# Patient Record
Sex: Male | Born: 1970 | Race: White | Hispanic: No | Marital: Married | Smoking: Never smoker
Health system: Southern US, Community
[De-identification: ages and names within clinical notes are randomized; demographics above are authoritative.]

## PROBLEM LIST (undated history)

## (undated) DIAGNOSIS — I10 Essential (primary) hypertension: Secondary | ICD-10-CM

## (undated) DIAGNOSIS — E785 Hyperlipidemia, unspecified: Secondary | ICD-10-CM

## (undated) DIAGNOSIS — M109 Gout, unspecified: Secondary | ICD-10-CM

## (undated) DIAGNOSIS — R079 Chest pain, unspecified: Secondary | ICD-10-CM

## (undated) HISTORY — PX: CHOLECYSTECTOMY: SHX55

## (undated) HISTORY — DX: Hyperlipidemia, unspecified: E78.5

## (undated) HISTORY — DX: Chest pain, unspecified: R07.9

---

## 2001-07-14 ENCOUNTER — Encounter: Payer: Self-pay | Admitting: Internal Medicine

## 2001-07-14 ENCOUNTER — Ambulatory Visit (HOSPITAL_COMMUNITY): Admission: RE | Admit: 2001-07-14 | Discharge: 2001-07-14 | Payer: Self-pay | Admitting: Internal Medicine

## 2001-08-22 ENCOUNTER — Ambulatory Visit (HOSPITAL_COMMUNITY): Admission: RE | Admit: 2001-08-22 | Discharge: 2001-08-22 | Payer: Self-pay | Admitting: General Surgery

## 2003-05-05 ENCOUNTER — Inpatient Hospital Stay (HOSPITAL_COMMUNITY): Admission: EM | Admit: 2003-05-05 | Discharge: 2003-05-07 | Payer: Self-pay | Admitting: Emergency Medicine

## 2004-03-30 ENCOUNTER — Emergency Department (HOSPITAL_COMMUNITY): Admission: EM | Admit: 2004-03-30 | Discharge: 2004-03-31 | Payer: Self-pay | Admitting: Emergency Medicine

## 2005-05-04 ENCOUNTER — Ambulatory Visit: Payer: Self-pay | Admitting: Internal Medicine

## 2005-05-18 ENCOUNTER — Ambulatory Visit: Payer: Self-pay | Admitting: Internal Medicine

## 2005-05-29 ENCOUNTER — Encounter (INDEPENDENT_AMBULATORY_CARE_PROVIDER_SITE_OTHER): Payer: Self-pay | Admitting: Internal Medicine

## 2005-06-22 ENCOUNTER — Ambulatory Visit: Payer: Self-pay | Admitting: Internal Medicine

## 2005-07-20 ENCOUNTER — Ambulatory Visit: Payer: Self-pay | Admitting: Internal Medicine

## 2005-07-27 ENCOUNTER — Encounter (INDEPENDENT_AMBULATORY_CARE_PROVIDER_SITE_OTHER): Payer: Self-pay | Admitting: Internal Medicine

## 2005-07-27 LAB — CONVERTED CEMR LAB
RBC count: 4.59 10*6/uL
TSH: 2.102 microintl units/mL
WBC, blood: 8.2 10*3/uL

## 2006-02-01 ENCOUNTER — Ambulatory Visit: Payer: Self-pay | Admitting: Internal Medicine

## 2006-02-01 LAB — CONVERTED CEMR LAB: Hgb A1c MFr Bld: 8.8 %

## 2006-02-02 ENCOUNTER — Encounter (INDEPENDENT_AMBULATORY_CARE_PROVIDER_SITE_OTHER): Payer: Self-pay | Admitting: Internal Medicine

## 2006-02-10 ENCOUNTER — Ambulatory Visit: Payer: Self-pay | Admitting: Internal Medicine

## 2006-03-15 ENCOUNTER — Ambulatory Visit: Payer: Self-pay | Admitting: Internal Medicine

## 2006-04-03 ENCOUNTER — Encounter: Payer: Self-pay | Admitting: Internal Medicine

## 2006-04-03 DIAGNOSIS — R809 Proteinuria, unspecified: Secondary | ICD-10-CM | POA: Insufficient documentation

## 2006-04-03 DIAGNOSIS — G609 Hereditary and idiopathic neuropathy, unspecified: Secondary | ICD-10-CM | POA: Insufficient documentation

## 2006-04-03 DIAGNOSIS — E785 Hyperlipidemia, unspecified: Secondary | ICD-10-CM | POA: Insufficient documentation

## 2006-04-03 DIAGNOSIS — I10 Essential (primary) hypertension: Secondary | ICD-10-CM | POA: Insufficient documentation

## 2006-04-26 ENCOUNTER — Ambulatory Visit: Payer: Self-pay | Admitting: Internal Medicine

## 2006-05-13 ENCOUNTER — Encounter (INDEPENDENT_AMBULATORY_CARE_PROVIDER_SITE_OTHER): Payer: Self-pay | Admitting: Internal Medicine

## 2006-05-24 ENCOUNTER — Ambulatory Visit: Payer: Self-pay | Admitting: Internal Medicine

## 2006-05-24 DIAGNOSIS — E1165 Type 2 diabetes mellitus with hyperglycemia: Secondary | ICD-10-CM

## 2006-05-24 DIAGNOSIS — IMO0002 Reserved for concepts with insufficient information to code with codable children: Secondary | ICD-10-CM | POA: Insufficient documentation

## 2006-05-24 DIAGNOSIS — E118 Type 2 diabetes mellitus with unspecified complications: Secondary | ICD-10-CM

## 2006-05-24 LAB — CONVERTED CEMR LAB
Cholesterol, target level: 200 mg/dL
HDL goal, serum: 40 mg/dL
LDL Goal: 100 mg/dL

## 2006-05-25 ENCOUNTER — Encounter (INDEPENDENT_AMBULATORY_CARE_PROVIDER_SITE_OTHER): Payer: Self-pay | Admitting: Internal Medicine

## 2006-05-25 LAB — CONVERTED CEMR LAB
ALT: 26 units/L (ref 0–53)
AST: 18 units/L (ref 0–37)
Albumin: 4.3 g/dL (ref 3.5–5.2)
Alkaline Phosphatase: 109 units/L (ref 39–117)
BUN: 15 mg/dL (ref 6–23)
CO2: 20 meq/L (ref 19–32)
Calcium: 9.7 mg/dL (ref 8.4–10.5)
Chloride: 101 meq/L (ref 96–112)
Cholesterol: 160 mg/dL (ref 0–200)
Creatinine, Ser: 0.76 mg/dL (ref 0.40–1.50)
Creatinine, Urine: 74.5 mg/dL
Glucose, Bld: 223 mg/dL — ABNORMAL HIGH (ref 70–99)
HDL: 42 mg/dL (ref >39)
LDL Cholesterol: 89 mg/dL (ref 0–99)
Microalb Creat Ratio: 49.7 mg/g — ABNORMAL HIGH (ref 0.0–30.0)
Microalb, Ur: 3.7 mg/dL — ABNORMAL HIGH (ref 0.00–1.89)
Potassium: 4.2 meq/L (ref 3.5–5.3)
Sodium: 140 meq/L (ref 135–145)
Total Bilirubin: 0.6 mg/dL (ref 0.3–1.2)
Total CHOL/HDL Ratio: 3.8
Total Protein: 7.7 g/dL (ref 6.0–8.3)
Triglycerides: 145 mg/dL (ref <150)
VLDL: 29 mg/dL (ref 0–40)

## 2006-06-16 ENCOUNTER — Encounter (INDEPENDENT_AMBULATORY_CARE_PROVIDER_SITE_OTHER): Payer: Self-pay | Admitting: Internal Medicine

## 2006-06-22 ENCOUNTER — Telehealth (INDEPENDENT_AMBULATORY_CARE_PROVIDER_SITE_OTHER): Payer: Self-pay | Admitting: Internal Medicine

## 2006-08-13 ENCOUNTER — Encounter (INDEPENDENT_AMBULATORY_CARE_PROVIDER_SITE_OTHER): Payer: Self-pay | Admitting: Internal Medicine

## 2006-08-16 ENCOUNTER — Ambulatory Visit: Payer: Self-pay | Admitting: Internal Medicine

## 2006-08-16 DIAGNOSIS — G4700 Insomnia, unspecified: Secondary | ICD-10-CM | POA: Insufficient documentation

## 2006-08-16 LAB — CONVERTED CEMR LAB: Hgb A1c MFr Bld: 9.9 %

## 2006-08-20 ENCOUNTER — Encounter (INDEPENDENT_AMBULATORY_CARE_PROVIDER_SITE_OTHER): Payer: Self-pay | Admitting: Internal Medicine

## 2006-09-13 ENCOUNTER — Ambulatory Visit: Payer: Self-pay | Admitting: Internal Medicine

## 2006-10-11 ENCOUNTER — Ambulatory Visit: Payer: Self-pay | Admitting: Internal Medicine

## 2006-11-08 ENCOUNTER — Ambulatory Visit: Payer: Self-pay | Admitting: Internal Medicine

## 2006-11-08 LAB — CONVERTED CEMR LAB: Hgb A1c MFr Bld: 10.8 %

## 2006-11-09 ENCOUNTER — Encounter (INDEPENDENT_AMBULATORY_CARE_PROVIDER_SITE_OTHER): Payer: Self-pay | Admitting: Internal Medicine

## 2006-11-10 ENCOUNTER — Telehealth (INDEPENDENT_AMBULATORY_CARE_PROVIDER_SITE_OTHER): Payer: Self-pay | Admitting: *Deleted

## 2006-11-10 LAB — CONVERTED CEMR LAB
ALT: 20 units/L (ref 0–53)
CO2: 25 meq/L (ref 19–32)
Calcium: 9.9 mg/dL (ref 8.4–10.5)
Chloride: 101 meq/L (ref 96–112)
Cholesterol: 155 mg/dL (ref 0–200)
Sodium: 137 meq/L (ref 135–145)
Total Bilirubin: 0.7 mg/dL (ref 0.3–1.2)
Total Protein: 7.8 g/dL (ref 6.0–8.3)
VLDL: 22 mg/dL (ref 0–40)

## 2006-12-20 ENCOUNTER — Ambulatory Visit: Payer: Self-pay | Admitting: Internal Medicine

## 2007-01-31 ENCOUNTER — Ambulatory Visit: Payer: Self-pay | Admitting: Internal Medicine

## 2007-01-31 LAB — CONVERTED CEMR LAB
Blood Glucose, Fingerstick: 122
Hgb A1c MFr Bld: 8.8 %

## 2007-02-22 ENCOUNTER — Encounter (INDEPENDENT_AMBULATORY_CARE_PROVIDER_SITE_OTHER): Payer: Self-pay | Admitting: Internal Medicine

## 2007-03-30 ENCOUNTER — Ambulatory Visit: Payer: Self-pay | Admitting: Internal Medicine

## 2007-03-30 DIAGNOSIS — H6690 Otitis media, unspecified, unspecified ear: Secondary | ICD-10-CM | POA: Insufficient documentation

## 2007-04-24 ENCOUNTER — Emergency Department (HOSPITAL_COMMUNITY): Admission: EM | Admit: 2007-04-24 | Discharge: 2007-04-24 | Payer: Self-pay | Admitting: Family Medicine

## 2007-04-25 ENCOUNTER — Ambulatory Visit: Payer: Self-pay | Admitting: Internal Medicine

## 2007-04-25 DIAGNOSIS — M940 Chondrocostal junction syndrome [Tietze]: Secondary | ICD-10-CM | POA: Insufficient documentation

## 2007-04-25 DIAGNOSIS — R059 Cough, unspecified: Secondary | ICD-10-CM | POA: Insufficient documentation

## 2007-04-25 DIAGNOSIS — R05 Cough: Secondary | ICD-10-CM | POA: Insufficient documentation

## 2007-05-09 ENCOUNTER — Ambulatory Visit: Payer: Self-pay | Admitting: Internal Medicine

## 2007-05-10 ENCOUNTER — Telehealth (INDEPENDENT_AMBULATORY_CARE_PROVIDER_SITE_OTHER): Payer: Self-pay | Admitting: *Deleted

## 2007-05-10 LAB — CONVERTED CEMR LAB
Alkaline Phosphatase: 91 units/L (ref 39–117)
Creatinine, Ser: 0.92 mg/dL (ref 0.40–1.50)
Glucose, Bld: 197 mg/dL — ABNORMAL HIGH (ref 70–99)
HDL: 58 mg/dL (ref >39)
LDL Cholesterol: 109 mg/dL — ABNORMAL HIGH (ref 0–99)
Sodium: 138 meq/L (ref 135–145)
Total Bilirubin: 0.6 mg/dL (ref 0.3–1.2)
Total CHOL/HDL Ratio: 3.3
Total Protein: 8.5 g/dL — ABNORMAL HIGH (ref 6.0–8.3)
Triglycerides: 115 mg/dL (ref <150)
VLDL: 23 mg/dL (ref 0–40)

## 2007-06-21 ENCOUNTER — Telehealth (INDEPENDENT_AMBULATORY_CARE_PROVIDER_SITE_OTHER): Payer: Self-pay | Admitting: *Deleted

## 2007-07-04 ENCOUNTER — Ambulatory Visit: Payer: Self-pay | Admitting: Internal Medicine

## 2007-07-04 DIAGNOSIS — E669 Obesity, unspecified: Secondary | ICD-10-CM | POA: Insufficient documentation

## 2007-08-18 ENCOUNTER — Ambulatory Visit: Payer: Self-pay | Admitting: Internal Medicine

## 2007-09-12 ENCOUNTER — Ambulatory Visit: Payer: Self-pay | Admitting: Internal Medicine

## 2007-11-14 ENCOUNTER — Ambulatory Visit: Payer: Self-pay | Admitting: Internal Medicine

## 2007-11-15 ENCOUNTER — Encounter (INDEPENDENT_AMBULATORY_CARE_PROVIDER_SITE_OTHER): Payer: Self-pay | Admitting: Internal Medicine

## 2007-11-16 LAB — CONVERTED CEMR LAB
Albumin: 4.7 g/dL (ref 3.5–5.2)
BUN: 21 mg/dL (ref 6–23)
CO2: 22 meq/L (ref 19–32)
Calcium: 9.7 mg/dL (ref 8.4–10.5)
Chloride: 101 meq/L (ref 96–112)
Cholesterol: 154 mg/dL (ref 0–200)
Creatinine, Ser: 0.84 mg/dL (ref 0.40–1.50)
Creatinine, Urine: 190.2 mg/dL
HDL: 55 mg/dL (ref >39)
Hemoglobin: 15.4 g/dL (ref 13.0–17.0)
Lymphocytes Relative: 35 % (ref 12–46)
Lymphs Abs: 4.1 10*3/uL — ABNORMAL HIGH (ref 0.7–4.0)
Microalb Creat Ratio: 38.9 mg/g — ABNORMAL HIGH (ref 0.0–30.0)
Monocytes Absolute: 1.1 10*3/uL — ABNORMAL HIGH (ref 0.1–1.0)
Monocytes Relative: 10 % (ref 3–12)
Neutro Abs: 6.3 10*3/uL (ref 1.7–7.7)
Neutrophils Relative %: 54 % (ref 43–77)
Potassium: 4.1 meq/L (ref 3.5–5.3)
RBC: 5.08 M/uL (ref 4.22–5.81)
Total CHOL/HDL Ratio: 2.8
WBC: 11.7 10*3/uL — ABNORMAL HIGH (ref 4.0–10.5)

## 2007-11-21 ENCOUNTER — Ambulatory Visit: Payer: Self-pay | Admitting: Internal Medicine

## 2007-11-21 DIAGNOSIS — J020 Streptococcal pharyngitis: Secondary | ICD-10-CM | POA: Insufficient documentation

## 2007-11-21 LAB — CONVERTED CEMR LAB
Inflenza A Ag: NEGATIVE
Rapid Strep: POSITIVE

## 2007-11-23 ENCOUNTER — Encounter (INDEPENDENT_AMBULATORY_CARE_PROVIDER_SITE_OTHER): Payer: Self-pay | Admitting: Internal Medicine

## 2007-12-26 ENCOUNTER — Ambulatory Visit: Payer: Self-pay | Admitting: Internal Medicine

## 2008-01-19 ENCOUNTER — Ambulatory Visit: Payer: Self-pay | Admitting: Internal Medicine

## 2008-02-06 ENCOUNTER — Ambulatory Visit: Payer: Self-pay | Admitting: Internal Medicine

## 2008-02-06 LAB — CONVERTED CEMR LAB
Blood Glucose, Fingerstick: 271
Hgb A1c MFr Bld: 9.2 %

## 2008-03-07 ENCOUNTER — Telehealth (INDEPENDENT_AMBULATORY_CARE_PROVIDER_SITE_OTHER): Payer: Self-pay | Admitting: Internal Medicine

## 2008-04-02 ENCOUNTER — Ambulatory Visit: Payer: Self-pay | Admitting: Internal Medicine

## 2008-04-30 ENCOUNTER — Ambulatory Visit: Payer: Self-pay | Admitting: Internal Medicine

## 2008-04-30 DIAGNOSIS — M25559 Pain in unspecified hip: Secondary | ICD-10-CM | POA: Insufficient documentation

## 2008-04-30 DIAGNOSIS — M25579 Pain in unspecified ankle and joints of unspecified foot: Secondary | ICD-10-CM | POA: Insufficient documentation

## 2008-04-30 LAB — CONVERTED CEMR LAB: Hgb A1c MFr Bld: 10.1 %

## 2008-06-21 ENCOUNTER — Ambulatory Visit: Payer: Self-pay | Admitting: Internal Medicine

## 2008-06-21 DIAGNOSIS — J019 Acute sinusitis, unspecified: Secondary | ICD-10-CM | POA: Insufficient documentation

## 2008-07-03 ENCOUNTER — Ambulatory Visit: Payer: Self-pay | Admitting: Internal Medicine

## 2008-07-03 DIAGNOSIS — A088 Other specified intestinal infections: Secondary | ICD-10-CM | POA: Insufficient documentation

## 2008-07-03 DIAGNOSIS — D72829 Elevated white blood cell count, unspecified: Secondary | ICD-10-CM | POA: Insufficient documentation

## 2008-07-03 DIAGNOSIS — E860 Dehydration: Secondary | ICD-10-CM | POA: Insufficient documentation

## 2008-07-03 LAB — CONVERTED CEMR LAB
AST: 16 units/L (ref 0–37)
Albumin: 3.9 g/dL (ref 3.5–5.2)
Alkaline Phosphatase: 67 units/L (ref 39–117)
BUN: 26 mg/dL — ABNORMAL HIGH (ref 6–23)
Basophils Absolute: 0 10*3/uL (ref 0.0–0.1)
Basophils Relative: 0 % (ref 0–1)
Blood in Urine, dipstick: NEGATIVE
Creatinine, Ser: 0.97 mg/dL (ref 0.40–1.50)
Eosinophils Absolute: 0 10*3/uL (ref 0.0–0.7)
Eosinophils Relative: 0 % (ref 0–5)
Glucose, Bld: 353 mg/dL — ABNORMAL HIGH (ref 70–99)
Glucose, Urine, Semiquant: >=1000
HCT: 43 % (ref 39.0–52.0)
Hemoglobin: 14.7 g/dL (ref 13.0–17.0)
MCHC: 34.2 g/dL (ref 30.0–36.0)
MCV: 91.3 fL (ref 78.0–100.0)
Monocytes Absolute: 0.2 10*3/uL (ref 0.1–1.0)
Nitrite: NEGATIVE
Platelets: 226 10*3/uL (ref 150–400)
Potassium: 4.1 meq/L (ref 3.5–5.3)
Protein, U semiquant: 100
RDW: 12.4 % (ref 11.5–15.5)
Total Bilirubin: 1.5 mg/dL — ABNORMAL HIGH (ref 0.3–1.2)
WBC Urine, dipstick: NEGATIVE
pH: 5.5

## 2008-08-29 ENCOUNTER — Telehealth (INDEPENDENT_AMBULATORY_CARE_PROVIDER_SITE_OTHER): Payer: Self-pay | Admitting: Internal Medicine

## 2008-09-27 ENCOUNTER — Telehealth (INDEPENDENT_AMBULATORY_CARE_PROVIDER_SITE_OTHER): Payer: Self-pay | Admitting: Internal Medicine

## 2008-10-22 ENCOUNTER — Ambulatory Visit: Payer: Self-pay | Admitting: Internal Medicine

## 2008-10-23 LAB — CONVERTED CEMR LAB
Albumin: 4.4 g/dL (ref 3.5–5.2)
Alkaline Phosphatase: 68 units/L (ref 39–117)
Calcium: 9.3 mg/dL (ref 8.4–10.5)
Chloride: 105 meq/L (ref 96–112)
Glucose, Bld: 188 mg/dL — ABNORMAL HIGH (ref 70–99)
LDL Cholesterol: 86 mg/dL (ref 0–99)
Potassium: 4.2 meq/L (ref 3.5–5.3)
Sodium: 141 meq/L (ref 135–145)
Total Protein: 7.4 g/dL (ref 6.0–8.3)
Triglycerides: 163 mg/dL — ABNORMAL HIGH (ref <150)

## 2008-11-02 ENCOUNTER — Telehealth (INDEPENDENT_AMBULATORY_CARE_PROVIDER_SITE_OTHER): Payer: Self-pay | Admitting: *Deleted

## 2008-11-05 ENCOUNTER — Encounter (INDEPENDENT_AMBULATORY_CARE_PROVIDER_SITE_OTHER): Payer: Self-pay | Admitting: Internal Medicine

## 2008-11-27 ENCOUNTER — Encounter (INDEPENDENT_AMBULATORY_CARE_PROVIDER_SITE_OTHER): Payer: Self-pay | Admitting: Internal Medicine

## 2010-04-27 LAB — CONVERTED CEMR LAB: Hgb A1c MFr Bld: 9.6 %

## 2010-04-29 NOTE — Assessment & Plan Note (Signed)
Summary: strep pharyngitis   Vital Signs:  Patient Profile:   40 Years Old Male Height:     70 inches (177.8 cm) O2 Sat:      97 % O2 treatment:    Room Air Temp:     98.8 degrees F tympanic Pulse rate:   107 / minute Resp:     9 per minute BP sitting:   110 / 78  (left arm)  Vitals Entered By: Lutricia Horsfall (November 21, 2007 1:14 PM)                 Chief Complaint:  body aches and ear aches.  History of Present Illness: Complaining of feeling crappy since yesterday.  Sore throat and ear pain with swallowing. Chills last night.  + body aches.     Current Allergies (reviewed today): ! GLUCOPHAGE  Past Medical History:    Reviewed history from 08/16/2006 and no changes required:       Diabetes mellitus, type II, uncontrolled, multiple complications       Hyperlipidemia       Hypertension       Peripheral neuropathy       microalbuminuria       ?retinopathy       cerumen impaction      Physical Exam  General:     Obese, in no acute distress. Alert and oriented X 3.  Ears:     Tympanic membranes clear.  Ear canals without erythema.  Mouth:     Substantial erythema, no exudate Neck:     + anterior cervical lymphadenopathy.      Impression & Recommendations:  Problem # 1:  STREPTOCOCCAL PHARYNGITIS (ICD-034.0) 7 days of doxycycline.  Instructed on ease of transmission and need to get children checked for fevers, sore thoat, etc.   His updated medication list for this problem includes:    Doxycycline Hyclate 100 Mg Caps (Doxycycline hyclate) .Marland Kitchen... 1 by mouth two times a day for 7 days  Orders: Rapid Strep (16109) Flu A+B (60454)   Complete Medication List: 1)  Tekturna Hct 300-12.5 Mg Tabs (Aliskiren-hydrochlorothiazide) .Marland Kitchen.. 1 by mouth once daily 2)  Lantus Soln (Insulin glargine soln) .... 70 units subcutaneously at bedtime 3)  Lisinopril 40 Mg Tabs (Lisinopril) .... Once daily 4)  Lipitor 20 Mg Tabs (Atorvastatin calcium) .Marland Kitchen.. 1 by mouth once  daily 5)  Avandia 8 Mg Tabs (Rosiglitazone maleate) .Marland Kitchen.. 1 by mouth once daily 6)  Glucometer Strips For Free Style Flast  .... Use as directed 7)  Bd Insulin Syringe Ultrafine 29g X 1/2" 1 Ml Misc (Insulin syringe-needle u-100) .... Use as directed 8)  Trazodone Hcl 50 Mg Tabs (Trazodone hcl) .... 2 by mouth 30 to 60 minutes before bed 9)  Amlodipine Besylate 10 Mg Tabs (Amlodipine besylate) .Marland Kitchen.. 1 by mouth once daily 10)  Humalog 100 Unit/ml Soln (Insulin lispro (human)) .Marland Kitchen.. 10 units subcutaneously before supper 11)  Doxycycline Hyclate 100 Mg Caps (Doxycycline hyclate) .Marland Kitchen.. 1 by mouth two times a day for 7 days    Prescriptions: DOXYCYCLINE HYCLATE 100 MG CAPS (DOXYCYCLINE HYCLATE) 1 by mouth two times a day for 7 days  #14 x 0   Entered and Authorized by:   Erle Crocker MD   Signed by:   Erle Crocker MD on 11/21/2007   Method used:   Printed then faxed to ...       [facility]* (retail)       [address]  [place], [place]  [address]       Ph: [phone] or [phone]       Fax: [phone]   RxID:   [id number]  ] Laboratory Results  Date/Time Received: November 21, 2007 1:28 PM   Other Tests  Rapid Strep: positive Influenza A: negative Influenza B: negative Comments: Strep Lot # [id number] exp 07/20/08 Flu Lot # [id number] exp 01/15/08  Bilat ears flushed using 50% hydrogen peroxide and 50% warm water in elephant ear wash system.  Pt. tolerated procedure well.  Sherrie Gardner  November 21, 2007 1:29 PM

## 2010-08-15 NOTE — Op Note (Signed)
[facility]  Patient:    Timothy Weiss, Timothy Weiss Visit Number: [id number] MRN: [id number]          Service Type: DSU Location: DAY Attending Physician:  Dalia Heading Dictated by:   Franky Macho, M.D. Proc. Date: 08/22/01 Admit Date:  08/22/2001   CC:         Elfredia Nevins, M.D.   Operative Report  PATIENT AGE:  40 years old  PREOPERATIVE DIAGNOSIS:  Biliary colic secondary cholelithiasis.  POSTOPERATIVE DIAGNOSIS:  Biliary colic secondary cholelithiasis.  OPERATION:  Laparoscopic cholecystectomy.  SURGEON:  Franky Macho, M.D.  ANESTHESIA:  General endotracheal anesthesia.  INDICATIONS:  The patient is a 40 year old white male who was referred for evaluation and treatment of biliary colic secondary to cholelithiasis.  The risks and benefits of the procedure including bleeding, infection, hepatobiliary injury, and the possibility of an open procedure were fully explained to the patient who gave informed consent.  DESCRIPTION OF PROCEDURE:  The patient was placed in the supine position. After induction of general endotracheal anesthesia, the abdomen was prepped and draped using the usual sterile technique with Betadine.  A supraumbilical incision was made down to the fascia.  A Veress needle was introduced into the abdominal cavity, and confirmation of placement was done using the saline drop test.  The abdomen was then insufflated to 16 mmHg pressure.  An 11 mm trocar was introduced into the abdominal cavity under direct visualization without difficulty.  The patient was placed in reverse Trendelenburg position, and an additional 11 mm trocar was placed in the epigastric region, and 5 mm trocars were placed in the right upper quadrant and right flank regions.  The liver was inspected and noted to be within normal limits.  The gallbladder was retracted superiorly and laterally.  The dissection was begun around the infundibulum of the  gallbladder.  The cystic duct was first identified.  Its juncture to the infundibulum was fully identified.  Endoclips were placed proximally and distally on the cystic duct, and the cystic duct was divided.  This was likewise done on the cystic artery. The gallbladder was then freed away from the gallbladder fossa using Bovie electrocautery.  The gallbladder was delivered through the epigastric trocar site using Endocatch bag without difficulty.  The gallbladder fossa was inspected, and no abnormal bleeding or bile leakage was noted.  Surgicel was placed in the gallbladder fossa.  The subhepatic space as well as right hepatic gutter were irrigated with normal saline.  All fluid and air were then evacuated from the abdominal cavity prior to removal of the trocars.  All wounds were irrigated with normal saline.  All wounds were injected with 0.5% Sensorcaine.  The supraumbilical fascia fascia as well as epigastric fascia were reapproximated using an 0 Vicryl interrupted suture.  All skin incisions were closed using 4-0 Vicryl subcuticular sutures.  Steri-Strips and dry sterile dressings were applied.  All tape and needle counts were correct at the end of the procedure.  The patient was extubated in the operating room and went back to the recovery room awake and in stable condition.  COMPLICATIONS:  None.  SPECIMEN:  Gallbladder with stones.  ESTIMATED BLOOD LOSS:  Minimal. Dictated by:   Franky Macho, M.D. Attending Physician:  Dalia Heading DD:  08/22/01 TD:  08/23/01 Job: [id number]

## 2010-08-15 NOTE — H&P (Signed)
NAME:  Timothy Weiss, Timothy Weiss                       ACCOUNT NO.:  [id number]   MEDICAL RECORD NO.:  [id number]                   PATIENT TYPE:  INP   LOCATION:  A331                                 FACILITY:  [facility]   PHYSICIAN:  Kirk Ruths, M.D.            DATE OF BIRTH:  12/15/1970   DATE OF ADMISSION:  05/05/2003  DATE OF DISCHARGE:                                HISTORY & PHYSICAL   CHIEF COMPLAINT:  Dizzy and vomiting.   HISTORY OF PRESENT ILLNESS:  This is a 40 year old type 2 diabetic who began  having nausea, vomiting, diarrhea in the middle of the night on the day of  admission.  Continued to vomit.  Was seen in the emergency room, given 4  liters of fluids and Zofran.  Continued to have vomiting and dizziness with  orthostatic changes.  He is admitted for further hydration and control of  his vomiting.   PAST MEDICAL HISTORY:  He has hypertension for which he takes Altace and  thiazide and he has a type 2 diabetes for which he takes glipizide.  The  patient is status post cholecystectomy.   He has no known allergies.   REVIEW OF SYSTEMS:  Denies chest pain or shortness of breath.   PHYSICAL EXAMINATION:  GENERAL:  A young white male who appears somewhat  miserable.  VITAL SIGNS:  He is afebrile.  Pulse is 120 and regular, blood pressure is  130/80.  The patient's pulse increases significantly as his pressure drops  some upon arising and the patient gets noticeably orthostatic.  Respirations  20.  HEENT:  Pupils equal and reactive to light and accommodation. Oropharynx  benign.  NECK:  Supple with JVD, bruit or thyromegaly.  LUNGS:  Clear in all areas.  HEART:  With a regular sinus rhythm without murmur, gallop, or rub.  ABDOMEN:  Soft and nontender, positive bowel sounds.  EXTREMITIES:  Without clubbing, cyanosis, or edema.  NEUROLOGICAL:  Grossly intact.   ASSESSMENT:  1. Acute gastroenteritis.  2. Type 2 diabetes controlled.  3. Hypertension.  4. Orthostatic  changes.     ___________________________________________                                         Kirk Ruths, M.D.   WMM/MEDQ  D:  05/06/2003  T:  05/06/2003  Job:  [id number]

## 2010-08-15 NOTE — Discharge Summary (Signed)
NAME:  Timothy Weiss, Timothy Weiss                       ACCOUNT NO.:  [id number]   MEDICAL RECORD NO.:  [id number]                   PATIENT TYPE:  INP   LOCATION:  A331                                 FACILITY:  [facility]   PHYSICIAN:  Kirk Ruths, M.D.            DATE OF BIRTH:  26-Jan-1971   DATE OF ADMISSION:  05/05/2003  DATE OF DISCHARGE:  05/07/2003                                 DISCHARGE SUMMARY   DISCHARGE DIAGNOSES:  1. Acute gastroenteritis.  2. Type 2 diabetes, controlled.  3. Hypertension.  4. Orthostasis.   HOSPITAL COURSE:  This is a 40 year old, type 2 diabetic who has been  admitted with nausea, vomiting and diarrhea in the middle of the night.  The  patient was seen and evaluated in the emergency room where he was dizzy and  continued to vomit despite 4 L of Zofran.  Blood sugar was fairly well-  controlled.  He was admitted for control of his vomiting and diarrhea.  He  was noted to have a white count of 11,800 with a hemoglobin of 15 on  admission.  His potassium was 2.9 and glucose 153.   The patient was admitted to the floor and treated with antiemetics with ice  chips throughout the night.  He was doing much better the following day.  Dizziness and orthostasis had resolved, but his potassium was still found to  be low that morning at 2.9.  It had actually been 3.6 on admission.  Potassium was supplemented in his IV fluids as well as p.o.  Diet was slowly  advanced throughout the day.  At the time of discharge, he is tolerating a  regular diet.  His blood sugars have remained in the 150-200 range off  medications.  Blood pressure has been fairly well-controlled.  His  temperature maxed out at 100.6 the day before discharge.  His urinalysis was  negative.   DISCHARGE MEDICATIONS:  Regular medications.   FOLLOW UP:  Follow up in the office as needed.     ___________________________________________                                         Kirk Ruths,  M.D.   WMM/MEDQ  D:  05/07/2003  T:  05/07/2003  Job:  [id number]

## 2010-08-15 NOTE — H&P (Signed)
[facility]  Patient:    NAZARETH, KIRK Visit Number: [id number] MRN: [id number]          Service Type: OUT Location: RAD Attending Physician:  Cassell Smiles. Dictated by:   Franky Macho, M.D. Admit Date:  07/14/2001 Discharge Date: 07/14/2001   CC:         Elfredia Nevins, M.D.   History and Physical  AGE:  40 years old.  CHIEF COMPLAINT:  Biliary colic secondary to cholelithiasis.  HISTORY OF PRESENT ILLNESS:  The patient is a 40 year old white male who is referred for evaluation and treatment of biliary colic secondary to cholelithiasis.  He has been having right upper quadrant abdominal pain with radiation to the right flank, nausea, vomiting, fatty food intolerance, and bloating for the past five months.  No fever, chills, or jaundice have been noted.  There is no history of peptic ulcer disease.  PAST MEDICAL HISTORY:  Past medical history includes hypertension and non-insulin-dependent diabetes mellitus.  PAST SURGICAL HISTORY:  Unremarkable.  CURRENT MEDICATIONS: 1. Avandia 4 mg p.o. q.d. 2. Hydrochlorothiazide 12.5 mg p.o. q.d. 3. Glucotrol XL 10 mg p.o. q.d. 4. Altace 10 mg p.o. q.d.  ALLERGIES:  No known drug allergies.  REVIEW OF SYSTEMS:  Unremarkable.  PHYSICAL EXAMINATION:  GENERAL:  On physical examination, the patient is a well-developed, well-nourished white male in no acute distress.  VITAL SIGNS:  He is afebrile and vital signs are stable.  HEENT:  No scleral icterus.  LUNGS:  Clear to auscultation with equal breath sounds bilaterally.  HEART:  Examination reveals a regular rate and rhythm, without S3, S4, or murmurs.  ABDOMEN:  The abdomen is soft with tenderness noted in the right upper quadrant to palpation.  No hepatosplenomegaly, masses, or herniae are identified.  LABORATORY AND ACCESSORY DATA:  Ultrasound of the gallbladder reveals cholelithiasis with a normal common bile  duct.  IMPRESSION:  Biliary colic secondary to cholelithiasis.  PLAN:  The patient is scheduled for a laparoscopic cholecystectomy on Aug 22, 2001.  The risks and benefits of the procedure including bleeding, infection, hepatobiliary injury, and the possibility of an open procedure were fully explained to the patient, who gave informed consent. Dictated by:   Franky Macho, M.D. Attending Physician:  Cassell Smiles DD:  08/11/01 TD:  08/12/01 Job: [id number]

## 2011-04-30 ENCOUNTER — Emergency Department (HOSPITAL_COMMUNITY)
Admission: EM | Admit: 2011-04-30 | Discharge: 2011-04-30 | Disposition: A | Discharge status: Home / Self Care | Payer: BC Managed Care – PPO | Attending: Emergency Medicine | Admitting: Emergency Medicine

## 2011-04-30 ENCOUNTER — Encounter (HOSPITAL_COMMUNITY): Payer: Self-pay | Admitting: Emergency Medicine

## 2011-04-30 ENCOUNTER — Other Ambulatory Visit: Payer: Self-pay

## 2011-04-30 ENCOUNTER — Emergency Department (HOSPITAL_COMMUNITY): Payer: BC Managed Care – PPO

## 2011-04-30 DIAGNOSIS — Z794 Long term (current) use of insulin: Secondary | ICD-10-CM | POA: Insufficient documentation

## 2011-04-30 DIAGNOSIS — M25519 Pain in unspecified shoulder: Secondary | ICD-10-CM | POA: Insufficient documentation

## 2011-04-30 DIAGNOSIS — R079 Chest pain, unspecified: Secondary | ICD-10-CM | POA: Insufficient documentation

## 2011-04-30 DIAGNOSIS — R739 Hyperglycemia, unspecified: Secondary | ICD-10-CM

## 2011-04-30 DIAGNOSIS — E119 Type 2 diabetes mellitus without complications: Secondary | ICD-10-CM | POA: Insufficient documentation

## 2011-04-30 DIAGNOSIS — I10 Essential (primary) hypertension: Secondary | ICD-10-CM

## 2011-04-30 DIAGNOSIS — R110 Nausea: Secondary | ICD-10-CM | POA: Insufficient documentation

## 2011-04-30 DIAGNOSIS — Z9889 Other specified postprocedural states: Secondary | ICD-10-CM | POA: Insufficient documentation

## 2011-04-30 HISTORY — DX: Essential (primary) hypertension: I10

## 2011-04-30 LAB — COMPREHENSIVE METABOLIC PANEL
Albumin: 3.7 g/dL (ref 3.5–5.2)
Alkaline Phosphatase: 97 U/L (ref 39–117)
BUN: 15 mg/dL (ref 6–23)
Calcium: 10.3 mg/dL (ref 8.4–10.5)
Creatinine, Ser: 0.71 mg/dL (ref 0.50–1.35)
Potassium: 3.4 mEq/L — ABNORMAL LOW (ref 3.5–5.1)
Total Protein: 8.1 g/dL (ref 6.0–8.3)

## 2011-04-30 LAB — GLUCOSE, CAPILLARY
Glucose-Capillary: 315 mg/dL — ABNORMAL HIGH (ref 70–99)
Glucose-Capillary: 240 mg/dL — ABNORMAL HIGH (ref 70–99)

## 2011-04-30 LAB — POCT I-STAT TROPONIN I: Troponin i, poc: 0 ng/mL (ref 0.00–0.08)

## 2011-04-30 LAB — TROPONIN I: Troponin I: <0.3 ng/mL (ref <0.30)

## 2011-04-30 LAB — CBC
HCT: 42.8 % (ref 39.0–52.0)
MCH: 30.5 pg (ref 26.0–34.0)
MCHC: 34.3 g/dL (ref 30.0–36.0)
RDW: 12.2 % (ref 11.5–15.5)

## 2011-04-30 LAB — D-DIMER, QUANTITATIVE: D-Dimer, Quant: 0.3 ug/mL-FEU (ref 0.00–0.48)

## 2011-04-30 MED ORDER — ASPIRIN 81 MG PO CHEW
324.0000 mg | CHEWABLE_TABLET | Freq: Once | ORAL | Status: AC
  Administered 2011-04-30: 324 mg via ORAL
  Filled 2011-04-30: qty 4

## 2011-04-30 MED ORDER — ASPIRIN 81 MG PO CHEW: 324.0000 mg | CHEWABLE_TABLET | Freq: Once | ORAL | Status: DC

## 2011-04-30 MED ORDER — ONDANSETRON HCL 8 MG PO TABS: 8.0000 mg | ORAL_TABLET | Freq: Three times a day (TID) | ORAL | Status: AC | PRN

## 2011-04-30 MED ORDER — NITROGLYCERIN 0.4 MG SL SUBL: 0.4000 mg | SUBLINGUAL_TABLET | SUBLINGUAL | Status: DC | PRN

## 2011-04-30 MED ORDER — SODIUM CHLORIDE 0.9 % IV BOLUS (SEPSIS)
1000.0000 mL | Freq: Once | INTRAVENOUS | Status: AC
  Administered 2011-04-30: 1000 mL via INTRAVENOUS

## 2011-04-30 MED ORDER — ASPIRIN 325 MG PO TABS: 325.0000 mg | ORAL_TABLET | ORAL | Status: DC

## 2011-04-30 MED ORDER — ONDANSETRON HCL 4 MG/2ML IJ SOLN
4.0000 mg | Freq: Once | INTRAMUSCULAR | Status: AC
  Administered 2011-04-30: 4 mg via INTRAVENOUS
  Filled 2011-04-30: qty 2

## 2011-04-30 NOTE — ED Notes (Signed)
240 cbg

## 2011-04-30 NOTE — ED Provider Notes (Signed)
History     CSN: [id number]  Arrival date & time 04/30/11  1819   First MD Initiated Contact with Patient 04/30/11 1825      Chief Complaint  Patient presents with  . Chest Pain  . Hypertension    (Consider location/radiation/quality/duration/timing/severity/associated sxs/prior treatment) HPI Comments: Patient describes having rather sudden onset of generalized weakness while grocery shopping this evening,  Followed by a pressure sensation in his left axilla and anterior shoulder accompanied by nausea which started while resting on the couch this evening after arriving home from the store.  He at first thought his blood glucose may have dropped,  As he has a history of hypoglycemia,  So he drank some juice and ate peanut butter crackers prior to arrival.  His shoulder discomfort is resolved,  But continues to have nausea.  He denies chest pain and shortness of breath.  Patient is a 41 y.o. male presenting with chest pain and hypertension. The history is provided by the patient and a parent.  Chest Pain The chest pain began 1 - 2 hours ago. Chest pain occurs constantly. The chest pain is improving. The pain does not radiate. Primary symptoms include nausea. Pertinent negatives for primary symptoms include no fever, no shortness of breath, no abdominal pain and no dizziness.  Pertinent negatives for associated symptoms include no numbness and no weakness. He tried nothing for the symptoms.  His past medical history is significant for diabetes and hypertension.    Hypertension Associated symptoms include arthralgias and nausea. Pertinent negatives include no abdominal pain, chest pain, congestion, fever, headaches, joint swelling, neck pain, numbness, rash, sore throat or weakness.    Past Medical History  Diagnosis Date  . Hypertension   . Diabetes mellitus     Past Surgical History  Procedure Date  . Cholecystectomy     No family history on file.  History  Substance Use  Topics  . Smoking status: Never Smoker   . Smokeless tobacco: Not on file  . Alcohol Use: No      Review of Systems  Constitutional: Negative for fever.  HENT: Negative for congestion, sore throat and neck pain.   Eyes: Negative.   Respiratory: Negative for chest tightness and shortness of breath.   Cardiovascular: Negative for chest pain.  Gastrointestinal: Positive for nausea. Negative for abdominal pain.  Genitourinary: Negative.   Musculoskeletal: Positive for arthralgias. Negative for joint swelling.  Skin: Negative.  Negative for rash and wound.  Neurological: Negative for dizziness, weakness, light-headedness, numbness and headaches.  Hematological: Negative.   Psychiatric/Behavioral: Negative.     Allergies  Metformin  Home Medications   Current Outpatient Rx  Name Route Sig Dispense Refill  . AZILSARTAN-CHLORTHALIDONE 40-12.5 MG PO TABS Oral Take 1 tablet by mouth every morning.    . INSULIN ASPART 100 UNIT/ML [place] SOLN Subcutaneous Inject into the skin 3 (three) times daily before meals. As directed per sliding scale instructions    . INSULIN GLARGINE 100 UNIT/ML [place] SOLN Subcutaneous Inject 60 Units into the skin 2 (two) times daily.    Marland Kitchen LISINOPRIL 20 MG PO TABS Oral Take 20 mg by mouth every morning.      BP 164/92  Pulse 82  Temp(Src) 98.3 F (36.8 C) (Oral)  Resp 22  Ht 6' (1.829 m)  Wt 215 lb (97.523 kg)  BMI 29.16 kg/m2  SpO2 98%  Physical Exam  Nursing note and vitals reviewed. Constitutional: He is oriented to person, place, and time. He appears well-developed  and well-nourished.  HENT:  Head: Normocephalic and atraumatic.  Eyes: Conjunctivae are normal.  Neck: Normal range of motion.  Cardiovascular: Normal rate, regular rhythm, normal heart sounds and intact distal pulses.   Pulmonary/Chest: Effort normal and breath sounds normal. He has no wheezes.  Abdominal: Soft. Bowel sounds are normal. There is no tenderness.  Musculoskeletal: Normal  range of motion. He exhibits no edema and no tenderness.  Neurological: He is alert and oriented to person, place, and time.  Skin: Skin is warm and dry.  Psychiatric: He has a normal mood and affect.    ED Course  Procedures (including critical care time)  Labs Reviewed  GLUCOSE, CAPILLARY - Abnormal; Notable for the following:    Glucose-Capillary 315 (*)    All other components within normal limits  CBC - Abnormal; Notable for the following:    WBC 12.9 (*)    All other components within normal limits  COMPREHENSIVE METABOLIC PANEL - Abnormal; Notable for the following:    Potassium 3.4 (*)    Glucose, Bld 316 (*)    All other components within normal limits  GLUCOSE, CAPILLARY - Abnormal; Notable for the following:    Glucose-Capillary 240 (*)    All other components within normal limits  POCT I-STAT TROPONIN I  D-DIMER, QUANTITATIVE  TROPONIN I   Dg Chest Portable 1 View  04/30/2011  *RADIOLOGY REPORT*  Clinical Data: Hypertension, chest pain , and nausea  PORTABLE CHEST - 1 VIEW  Comparison: 04/24/2007  Findings: Mild cardiomegaly.  Lungs are clear.  No effusion. Regional bones unremarkable.  Vascular clips in the right upper abdomen.  IMPRESSION:  1.  Mild cardiomegaly.  Original Report Authenticated By: Thora Lance III, M.D.     1. Hypertension   2. Hyperglycemia       MDM  Dc home with instructions for close f/u with pcp for stress testing.        Candis Musa, PA 04/30/11 2241    Date: 04/30/2011  Rate: 82  Rhythm: normal sinus rhythm  QRS Axis: normal  Intervals: normal  ST/T Wave abnormalities: normal  Conduction Disutrbances:none  Narrative Interpretation: Q waves in V1-3  Old EKG Reviewed: none available    Candis Musa, PA 04/30/11 2245

## 2011-04-30 NOTE — ED Notes (Signed)
Pt with HTN and CP with nausea since 1 hour pta

## 2011-05-01 NOTE — ED Provider Notes (Signed)
Medical screening examination/treatment/procedure(s) were conducted as a shared visit with non-physician practitioner(s) and myself.  I personally evaluated the patient during the encounter  1 hour of L shoulder and axilla pain with nausea.  Now resolved.  Septal Q waves on EKG, no comparision.  Hx DM and HTN.  TIMI 0.  Atypical for ACS.  Stable for outpatient stress testing.  Glynn Octave, MD 05/01/11 1122

## 2012-07-12 ENCOUNTER — Emergency Department (HOSPITAL_COMMUNITY): Payer: BC Managed Care – PPO

## 2012-07-12 ENCOUNTER — Emergency Department (HOSPITAL_COMMUNITY)
Admission: EM | Admit: 2012-07-12 | Discharge: 2012-07-12 | Disposition: A | Discharge status: Home / Self Care | Payer: BC Managed Care – PPO | Attending: Emergency Medicine | Admitting: Emergency Medicine

## 2012-07-12 ENCOUNTER — Encounter (HOSPITAL_COMMUNITY): Payer: Self-pay | Admitting: Emergency Medicine

## 2012-07-12 DIAGNOSIS — R05 Cough: Secondary | ICD-10-CM | POA: Insufficient documentation

## 2012-07-12 DIAGNOSIS — Z79899 Other long term (current) drug therapy: Secondary | ICD-10-CM | POA: Insufficient documentation

## 2012-07-12 DIAGNOSIS — R079 Chest pain, unspecified: Secondary | ICD-10-CM

## 2012-07-12 DIAGNOSIS — Z794 Long term (current) use of insulin: Secondary | ICD-10-CM | POA: Insufficient documentation

## 2012-07-12 DIAGNOSIS — I10 Essential (primary) hypertension: Secondary | ICD-10-CM | POA: Insufficient documentation

## 2012-07-12 DIAGNOSIS — R059 Cough, unspecified: Secondary | ICD-10-CM | POA: Insufficient documentation

## 2012-07-12 DIAGNOSIS — E119 Type 2 diabetes mellitus without complications: Secondary | ICD-10-CM | POA: Insufficient documentation

## 2012-07-12 LAB — CBC
HCT: 43.2 % (ref 39.0–52.0)
Hemoglobin: 15.2 g/dL (ref 13.0–17.0)
MCH: 31 pg (ref 26.0–34.0)
MCHC: 35.2 g/dL (ref 30.0–36.0)

## 2012-07-12 LAB — BASIC METABOLIC PANEL
CO2: 26 mEq/L (ref 19–32)
Calcium: 9.8 mg/dL (ref 8.4–10.5)
Creatinine, Ser: 0.85 mg/dL (ref 0.50–1.35)
Glucose, Bld: 90 mg/dL (ref 70–99)

## 2012-07-12 LAB — TROPONIN I: Troponin I: <0.3 ng/mL (ref <0.30)

## 2012-07-12 MED ORDER — ASPIRIN 81 MG PO CHEW: 81.0000 mg | CHEWABLE_TABLET | Freq: Every day | ORAL | Status: DC

## 2012-07-12 MED ORDER — ASPIRIN 81 MG PO CHEW
324.0000 mg | CHEWABLE_TABLET | Freq: Once | ORAL | Status: AC
  Administered 2012-07-12: 324 mg via ORAL
  Filled 2012-07-12: qty 4

## 2012-07-12 MED ORDER — NITROGLYCERIN 0.4 MG SL SUBL
0.4000 mg | SUBLINGUAL_TABLET | SUBLINGUAL | Status: DC | PRN
  Administered 2012-07-12 (×2): 0.4 mg via SUBLINGUAL
  Filled 2012-07-12: qty 25

## 2012-07-12 NOTE — ED Notes (Signed)
Chest pain now 3/10, will give 2nd nitro. BP 161/86

## 2012-07-12 NOTE — ED Notes (Signed)
Pt called requesting to know if referral was made to Cardiology. Pt informed of discharge papers state to call [place] for stress test. Pt understood instructions.

## 2012-07-12 NOTE — ED Notes (Signed)
Pt complains of chest pain to the left chest and pain to his left side. States that the pain feels like a flutter and is shooting along his side.

## 2012-07-12 NOTE — ED Provider Notes (Signed)
History     CSN: [id number]  Arrival date & time 07/12/12  0602   First MD Initiated Contact with Patient 07/12/12 0602      Chief Complaint  Patient presents with  . Chest Pain    (Consider location/radiation/quality/duration/timing/severity/associated sxs/prior treatment) HPI HX per PT, L sided CP, described as fluttering, mild in severity, present the last 12 hours since 6pm yesterday, not radiating, no h/o same. No SOB, diaphoresis or back pain, some nausea. His wife has been sick with cough and he has started developing the same. No leg pain or swelling. No known aggrevating or alleviating factors. Nonsmoker, no early FH of CAD. Has never had a stress test Past Medical History  Diagnosis Date  . Hypertension   . Diabetes mellitus     Past Surgical History  Procedure Laterality Date  . Cholecystectomy      History reviewed. No pertinent family history.  History  Substance Use Topics  . Smoking status: Never Smoker   . Smokeless tobacco: Not on file  . Alcohol Use: No      Review of Systems  Constitutional: Negative for fever and chills.  HENT: Negative for neck pain and neck stiffness.   Eyes: Negative for pain.  Respiratory: Negative for shortness of breath.   Cardiovascular: Positive for chest pain.  Gastrointestinal: Negative for abdominal pain.  Genitourinary: Negative for dysuria and flank pain.  Musculoskeletal: Negative for back pain.  Skin: Negative for rash.  Neurological: Negative for headaches.  All other systems reviewed and are negative.    Allergies  Metformin  Home Medications   Current Outpatient Rx  Name  Route  Sig  Dispense  Refill  . Azilsartan-Chlorthalidone (EDARBYCLOR) 40-12.5 MG TABS   Oral   Take 1 tablet by mouth every morning.         . insulin aspart (NOVOLOG FLEXPEN) 100 UNIT/ML injection   Subcutaneous   Inject into the skin 3 (three) times daily before meals. As directed per sliding scale instructions          . insulin glargine (LANTUS SOLOSTAR) 100 UNIT/ML injection   Subcutaneous   Inject 60 Units into the skin 2 (two) times daily.         Marland Kitchen lisinopril (PRINIVIL,ZESTRIL) 20 MG tablet   Oral   Take 20 mg by mouth every morning.           There were no vitals taken for this visit.  Physical Exam  Constitutional: He is oriented to person, place, and time. He appears well-developed and well-nourished.  HENT:  Head: Normocephalic and atraumatic.  Eyes: Conjunctivae and EOM are normal. Pupils are equal, round, and reactive to light.  Neck: Trachea normal. Neck supple. No thyromegaly present.  Cardiovascular: Normal rate, regular rhythm, S1 normal, S2 normal and normal pulses.     No systolic murmur is present   No diastolic murmur is present  Pulses:      Radial pulses are 2+ on the right side, and 2+ on the left side.  Pulmonary/Chest: Effort normal and breath sounds normal. He has no wheezes. He has no rhonchi. He has no rales. He exhibits no tenderness.  Abdominal: Soft. Normal appearance and bowel sounds are normal. There is no tenderness. There is no CVA tenderness and negative Murphy's sign.  Musculoskeletal:  calves nontender, no cords or erythema, negative Homans sign  Neurological: He is alert and oriented to person, place, and time. He has normal strength. No cranial nerve deficit or sensory  deficit. GCS eye subscore is 4. GCS verbal subscore is 5. GCS motor subscore is 6.  Skin: Skin is warm and dry. No rash noted. He is not diaphoretic.  Psychiatric: His speech is normal.  Cooperative and appropriate    ED Course  Procedures (including critical care time)  Results for orders placed during the hospital encounter of 07/12/12  CBC      Result Value Range   WBC 10.5  4.0 - 10.5 K/uL   RBC 4.91  4.22 - 5.81 MIL/uL   Hemoglobin 15.2  13.0 - 17.0 g/dL   HCT 16.1  09.6 - 04.5 %   MCV 88.0  78.0 - 100.0 fL   MCH 31.0  26.0 - 34.0 pg   MCHC 35.2  30.0 - 36.0 g/dL   RDW  40.9  81.1 - 91.4 %   Platelets 238  150 - 400 K/uL  TROPONIN I      Result Value Range   Troponin I <0.30  <0.30 ng/mL  BASIC METABOLIC PANEL      Result Value Range   Sodium 140  135 - 145 mEq/L   Potassium 3.6  3.5 - 5.1 mEq/L   Chloride 103  96 - 112 mEq/L   CO2 26  19 - 32 mEq/L   Glucose, Bld 90  70 - 99 mg/dL   BUN 14  6 - 23 mg/dL   Creatinine, Ser 7.82  0.50 - 1.35 mg/dL   Calcium 9.8  8.4 - 95.6 mg/dL   GFR calc non Af Amer >90  >90 mL/min   GFR calc Af Amer >90  >90 mL/min   Dg Chest Portable 1 View  07/12/2012  *RADIOLOGY REPORT*  Clinical Data: Chest pain.  PORTABLE CHEST - 1 VIEW  Comparison: Chest radiograph performed 04/30/2011  Findings: The lungs are well-aerated and clear.  There is no evidence of focal opacification, pleural effusion or pneumothorax.  The cardiomediastinal silhouette is borderline normal in size.  No acute osseous abnormalities are seen.  IMPRESSION: No acute cardiopulmonary process seen.   Original Report Authenticated By: Tonia Ghent, M.D.        Date: 07/12/2012  Rate: 64  Rhythm: normal sinus rhythm  QRS Axis: left  Intervals: normal  ST/T Wave abnormalities: nonspecific ST changes  Conduction Disutrbances:none  Narrative Interpretation:   Old EKG Reviewed: none available  ASA, NTG  7:14 AM pain free. Plan f/u PCP and outpatient stress testing. PT agrees to strict return precautions. ASA daily MDM  CP  X 12 hours atypical for ACS but does have risk factors for same  ECG, labs CXR  ASA NTG          Sunnie Nielsen, MD 07/12/12 [phone]

## 2012-07-14 ENCOUNTER — Encounter: Payer: Self-pay | Admitting: *Deleted

## 2012-07-15 ENCOUNTER — Ambulatory Visit (INDEPENDENT_AMBULATORY_CARE_PROVIDER_SITE_OTHER): Payer: BC Managed Care – PPO | Admitting: Cardiology

## 2012-07-15 ENCOUNTER — Encounter: Payer: Self-pay | Admitting: *Deleted

## 2012-07-15 ENCOUNTER — Encounter: Payer: Self-pay | Admitting: Cardiology

## 2012-07-15 VITALS — BP 134/82 | HR 70 | Ht 72.0 in | Wt 221.0 lb

## 2012-07-15 DIAGNOSIS — I10 Essential (primary) hypertension: Secondary | ICD-10-CM

## 2012-07-15 DIAGNOSIS — IMO0002 Reserved for concepts with insufficient information to code with codable children: Secondary | ICD-10-CM

## 2012-07-15 DIAGNOSIS — E118 Type 2 diabetes mellitus with unspecified complications: Secondary | ICD-10-CM

## 2012-07-15 DIAGNOSIS — E785 Hyperlipidemia, unspecified: Secondary | ICD-10-CM

## 2012-07-15 DIAGNOSIS — R079 Chest pain, unspecified: Secondary | ICD-10-CM

## 2012-07-15 DIAGNOSIS — R55 Syncope and collapse: Secondary | ICD-10-CM

## 2012-07-15 NOTE — Progress Notes (Deleted)
Name: Timothy Weiss    DOB: [birth year]  Age: 42 y.o.  MR#: [id number]       PCP:  Dwana Melena, MD      Insurance: Payor: [employer]UE [employer]  Plan: [employer] [place] PPO  Product Type: *No Product type*    CC:   No chief complaint on file. medication list  VS Filed Vitals:   07/15/12 1052  BP: 150/98  Pulse: 66  Height: 6' (1.829 m)  Weight: 221 lb (100.245 kg)  SpO2: 99%    Weights Current Weight  07/15/12 221 lb (100.245 kg)  04/30/11 215 lb (97.523 kg)  10/22/08 231 lb (104.781 kg)    Blood Pressure  BP Readings from Last 3 Encounters:  07/15/12 150/98  07/12/12 148/78  04/30/11 164/92     Admit date:  (Not on file) Last encounter with RMR:  Visit date not found   Allergy Metformin  Current Outpatient Prescriptions  Medication Sig Dispense Refill  . amLODipine (NORVASC) 5 MG tablet Take 5 mg by mouth daily.      Marland Kitchen atorvastatin (LIPITOR) 20 MG tablet Take 20 mg by mouth daily.       . Azilsartan-Chlorthalidone (EDARBYCLOR) 40-12.5 MG TABS Take 1 tablet by mouth every morning.      Marland Kitchen BYSTOLIC 20 MG TABS Take 20 mg by mouth daily.       . insulin aspart (NOVOLOG FLEXPEN) 100 UNIT/ML injection Inject into the skin 3 (three) times daily before meals. As directed per sliding scale instructions      . insulin glargine (LANTUS SOLOSTAR) 100 UNIT/ML injection Inject 30-60 Units into the skin 2 (two) times daily. 60 units in AM and 30 units PM      . lisinopril (PRINIVIL,ZESTRIL) 20 MG tablet Take 20 mg by mouth every morning.      Marland Kitchen omeprazole (PRILOSEC) 20 MG capsule Take 20 mg by mouth daily.      Marland Kitchen aspirin 81 MG chewable tablet Chew 1 tablet (81 mg total) by mouth daily.  30 tablet  0   No current facility-administered medications for this visit.    Discontinued Meds:   There are no discontinued medications.  Patient Active Problem List  Diagnosis  . VIRAL GASTROENTERITIS  . STREPTOCOCCAL PHARYNGITIS  . DIABETES MELLITUS, TYPE II, UNCONTROLLED, WITH COMPLICATIONS  .  HYPERLIPIDEMIA  . DEHYDRATION  . OBESITY  . LEUKOCYTOSIS  . PERIPHERAL NEUROPATHY  . OTITIS MEDIA, ACUTE  . HYPERTENSION  . SINUSITIS, ACUTE  . HIP PAIN, RIGHT  . ANKLE PAIN, RIGHT  . COSTOCHONDRITIS, ACUTE  . INSOMNIA  . COUGH  . MICROALBUMINURIA    LABS    Component Value Date/Time   NA 140 07/12/2012 0630   NA 137 04/30/2011 1925   NA 141 10/22/2008 2208   K 3.6 07/12/2012 0630   K 3.4* 04/30/2011 1925   K 4.2 10/22/2008 2208   CL 103 07/12/2012 0630   CL 99 04/30/2011 1925   CL 105 10/22/2008 2208   CO2 26 07/12/2012 0630   CO2 26 04/30/2011 1925   CO2 23 10/22/2008 2208   GLUCOSE 90 07/12/2012 0630   GLUCOSE 316* 04/30/2011 1925   GLUCOSE 188* 10/22/2008 2208   BUN 14 07/12/2012 0630   BUN 15 04/30/2011 1925   BUN 17 10/22/2008 2208   CREATININE 0.85 07/12/2012 0630   CREATININE 0.71 04/30/2011 1925   CREATININE 1.11 10/22/2008 2208   CALCIUM 9.8 07/12/2012 0630   CALCIUM 10.3 04/30/2011 1925   CALCIUM  9.3 10/22/2008 2208   GFRNONAA >90 07/12/2012 0630   GFRNONAA >90 04/30/2011 1925   GFRAA >90 07/12/2012 0630   GFRAA >90 04/30/2011 1925   CMP     Component Value Date/Time   NA 140 07/12/2012 0630   K 3.6 07/12/2012 0630   CL 103 07/12/2012 0630   CO2 26 07/12/2012 0630   GLUCOSE 90 07/12/2012 0630   BUN 14 07/12/2012 0630   CREATININE 0.85 07/12/2012 0630   CALCIUM 9.8 07/12/2012 0630   PROT 8.1 04/30/2011 1925   ALBUMIN 3.7 04/30/2011 1925   AST 13 04/30/2011 1925   ALT 14 04/30/2011 1925   ALKPHOS 97 04/30/2011 1925   BILITOT 0.4 04/30/2011 1925   GFRNONAA >90 07/12/2012 0630   GFRAA >90 07/12/2012 0630       Component Value Date/Time   WBC 10.5 07/12/2012 0619   WBC 12.9* 04/30/2011 1925   WBC 13.7* 07/03/2008 1023   HGB 15.2 07/12/2012 0619   HGB 14.7 04/30/2011 1925   HGB 14.7 07/03/2008 1023   HCT 43.2 07/12/2012 0619   HCT 42.8 04/30/2011 1925   HCT 43.0 07/03/2008 1023   MCV 88.0 07/12/2012 0619   MCV 88.8 04/30/2011 1925   MCV 91.3 07/03/2008 1023    Lipid Panel     Component  Value Date/Time   CHOL 169 10/22/2008 2208   TRIG 163* 10/22/2008 2208   HDL 50 10/22/2008 2208   CHOLHDL 3.4 Ratio 10/22/2008 2208   VLDL 33 10/22/2008 2208   LDLCALC 86 10/22/2008 2208    ABG No results found for this basename: phart, pco2, pco2art, po2, po2art, hco3, tco2, acidbasedef, o2sat     Lab Results  Component Value Date   TSH 2.102 07/27/2005   BNP (last 3 results) No results found for this basename: PROBNP,  in the last 8760 hours Cardiac Panel (last 3 results) No results found for this basename: CKTOTAL, CKMB, TROPONINI, RELINDX,  in the last 72 hours  Iron/TIBC/Ferritin No results found for this basename: iron, tibc, ferritin     EKG Orders placed during the hospital encounter of 07/12/12  . ED EKG  . ED EKG  . EKG  . EKG     Prior Assessment and Plan Problem List as of 07/15/2012     ICD-9-CM     Cardiology Problems   HYPERLIPIDEMIA   HYPERTENSION     Other   VIRAL GASTROENTERITIS   STREPTOCOCCAL PHARYNGITIS   DIABETES MELLITUS, TYPE II, UNCONTROLLED, WITH COMPLICATIONS   DEHYDRATION   OBESITY   LEUKOCYTOSIS   PERIPHERAL NEUROPATHY   OTITIS MEDIA, ACUTE   SINUSITIS, ACUTE   HIP PAIN, RIGHT   ANKLE PAIN, RIGHT   COSTOCHONDRITIS, ACUTE   INSOMNIA   COUGH   MICROALBUMINURIA       Imaging: Dg Chest Portable 1 View  07/12/2012  *RADIOLOGY REPORT*  Clinical Data: Chest pain.  PORTABLE CHEST - 1 VIEW  Comparison: Chest radiograph performed 04/30/2011  Findings: The lungs are well-aerated and clear.  There is no evidence of focal opacification, pleural effusion or pneumothorax.  The cardiomediastinal silhouette is borderline normal in size.  No acute osseous abnormalities are seen.  IMPRESSION: No acute cardiopulmonary process seen.   Original Report Authenticated By: Tonia Ghent, M.D.

## 2012-07-15 NOTE — Patient Instructions (Addendum)
Your physician recommends that you schedule a follow-up appointment in: ONE MONTH WITH RR  Your physician has requested that you have a stress echocardiogram. For further information please visit [email or web address] Please follow instruction sheet as given.

## 2012-07-16 ENCOUNTER — Encounter: Payer: Self-pay | Admitting: Cardiology

## 2012-07-16 DIAGNOSIS — R079 Chest pain, unspecified: Secondary | ICD-10-CM | POA: Insufficient documentation

## 2012-07-16 NOTE — Assessment & Plan Note (Addendum)
Blood pressure control has been variable in the past, but much improved in recent contacts documented in Epic.  Current medical therapy appears adequate.

## 2012-07-16 NOTE — Assessment & Plan Note (Signed)
No recent lipid profile available to me. Based upon past values, current therapy with atorvastatin is likely adequate. Dr. Margo Aye is managing.

## 2012-07-16 NOTE — Assessment & Plan Note (Addendum)
Early-onset diabetes requiring substantial dose of insulin is certainly of concern. Dr. Margo Aye, patient's PCP, is providing excellent care for this problem.

## 2012-07-16 NOTE — Progress Notes (Signed)
Patient ID: Timothy Weiss, male   DOB: 07-19-1970, 42 y.o.   MRN: [id number] HPI: Initial Cardiology evaluation for this young gentleman referred from the [facility] ED for assessment of chest pain.  Patient has no cardiac history and has never undergone cardiac testing nor been evaluated by a cardiologist, but does have multiple cardiovascular risk factors. On the day of his emergency department visit, he first noted a fluttering sensation over the left chest and subsequent vague diffuse discomfort of mild to moderate severity. There were no associated symptoms. There was no chest wall tenderness. He did not identify anything that improved or exacerbated his discomfort. In the emergency department, cardiac markers and EKGs were unremarkable prompting referral to cardiology.  Current Outpatient Prescriptions on File Prior to Visit  Medication Sig Dispense Refill  . Azilsartan-Chlorthalidone (EDARBYCLOR) 40-12.5 MG TABS Take 1 tablet by mouth every morning.      . insulin aspart (NOVOLOG FLEXPEN) 100 UNIT/ML injection Inject into the skin 3 (three) times daily before meals. As directed per sliding scale instructions      . insulin glargine (LANTUS SOLOSTAR) 100 UNIT/ML injection Inject 30-60 Units into the skin 2 (two) times daily. 60 units in AM and 30 units PM      . lisinopril (PRINIVIL,ZESTRIL) 20 MG tablet Take 20 mg by mouth every morning.      Marland Kitchen aspirin 81 MG chewable tablet Chew 1 tablet (81 mg total) by mouth daily.  30 tablet  0   No current facility-administered medications on file prior to visit.   Allergies  Allergen Reactions  . Metformin Nausea And Vomiting    Past Medical History  Diagnosis Date  . Hypertension   . Diabetes mellitus     Past Surgical History  Procedure Laterality Date  . Cholecystectomy      Family History  Problem Relation Age of Onset  . Hypertension Mother     History   Social History  . Marital Status: Married    Spouse Name: N/A    Number of  Children: N/A  . Years of Education: N/A   Occupational History  . Not on file.   Social History Main Topics  . Smoking status: Never Smoker   . Smokeless tobacco: Not on file  . Alcohol Use: No  . Drug Use: No  . Sexually Active: Not on file   Other Topics Concern  . Not on file   Social History Narrative  . No narrative on file   ROS: Patient notes some dyspnea on exertion and exercise intolerance. He developed occasional diarrhea, headaches and insomnia. All other systems reviewed and are negative.  PHYSICAL EXAM: BP 134/82  Pulse 70  Ht 6' (1.829 m)  Wt 100.245 kg (221 lb)  BMI 29.97 kg/m2  SpO2 99%;  Body mass index is 29.97 kg/(m^2).   General-Well-developed; no acute distress Body Habitus-proportionate weight and height HEENT-[place]/AT; PERRL; EOM intact; conjunctiva and lids nl Neck-No JVD; no carotid bruits Endocrine-No thyromegaly Lungs-Clear lung fields; resonant percussion; normal I-to-E ratio Cardiovascular- normal PMI; normal S1 and S2 Abdomen-BS normal; soft and non-tender without masses or organomegaly Musculoskeletal-No deformities, cyanosis or clubbing Neurologic-Nl cranial nerves; symmetric strength and tone Skin- Warm, no significant lesions Extremities-decreased distal pulses; no edema  EKG:  Tracing performed 07/12/12 reviewed. Normal sinus rhythm; delayed R-wave progression; otherwise unremarkable.   Pana Bing, MD 07/16/2012  9:30 AM  ASSESSMENT AND PLAN

## 2012-07-16 NOTE — Assessment & Plan Note (Addendum)
Chest discomfort is atypical and has now resolved, but EKG is abnormal with very poor R wave progression. We will proceed with a stress echocardiogram for further cardiac evaluation, but I doubt that symptoms were related to coronary disease or that patient has suffered a myocardial infarction in the past.

## 2012-07-27 ENCOUNTER — Encounter (HOSPITAL_COMMUNITY): Payer: Self-pay | Admitting: Cardiology

## 2012-07-27 ENCOUNTER — Ambulatory Visit (HOSPITAL_COMMUNITY)
Admission: RE | Admit: 2012-07-27 | Discharge: 2012-07-27 | Disposition: A | Discharge status: Home / Self Care | Payer: BC Managed Care – PPO | Source: Ambulatory Visit | Attending: Cardiology | Admitting: Cardiology

## 2012-07-27 DIAGNOSIS — R079 Chest pain, unspecified: Secondary | ICD-10-CM

## 2012-07-27 DIAGNOSIS — R55 Syncope and collapse: Secondary | ICD-10-CM | POA: Insufficient documentation

## 2012-07-27 NOTE — Progress Notes (Signed)
*  PRELIMINARY RESULTS* Echocardiogram Echocardiogram Stress Test has been performed.  Timothy Weiss 07/27/2012, 10:25 AM

## 2012-07-27 NOTE — Progress Notes (Signed)
Stress Lab Nurses Notes - Dalin Caldera 07/27/2012 Reason for doing test: Chest Pain Type of test: Stress Echo Nurse performing test: Parke Poisson, RN Nuclear Medicine Tech: Not Applicable Echo Tech: Karrie Doffing MD performing test: R. Dietrich Pates Family MD: Dr. Margo Aye Test explained and consent signed: yes IV started: No IV started Symptoms: Fatigue  Treatment/Intervention: None Reason test stopped: fatigue After recovery IV was: NA Patient to return to Nuc. Med at : NA Patient discharged: Home Patient's Condition upon discharge was: stable Comments: During test peak BP 175/65 & HR 151 . Recovery BP 120/84 & HR 88.   Symptoms resolved in recovery. Erskine Speed T

## 2012-08-19 ENCOUNTER — Ambulatory Visit: Payer: BC Managed Care – PPO | Admitting: Cardiology

## 2012-12-20 ENCOUNTER — Encounter: Payer: Self-pay | Admitting: *Deleted

## 2014-08-02 IMAGING — CR DG CHEST 1V PORT
1 series · 1 of 1 positions shown · non-contrast
Comparison: Chest radiograph performed 04/30/2011

CLINICAL DATA: Chest pain.

PORTABLE CHEST - 1 VIEW

[view not recorded]
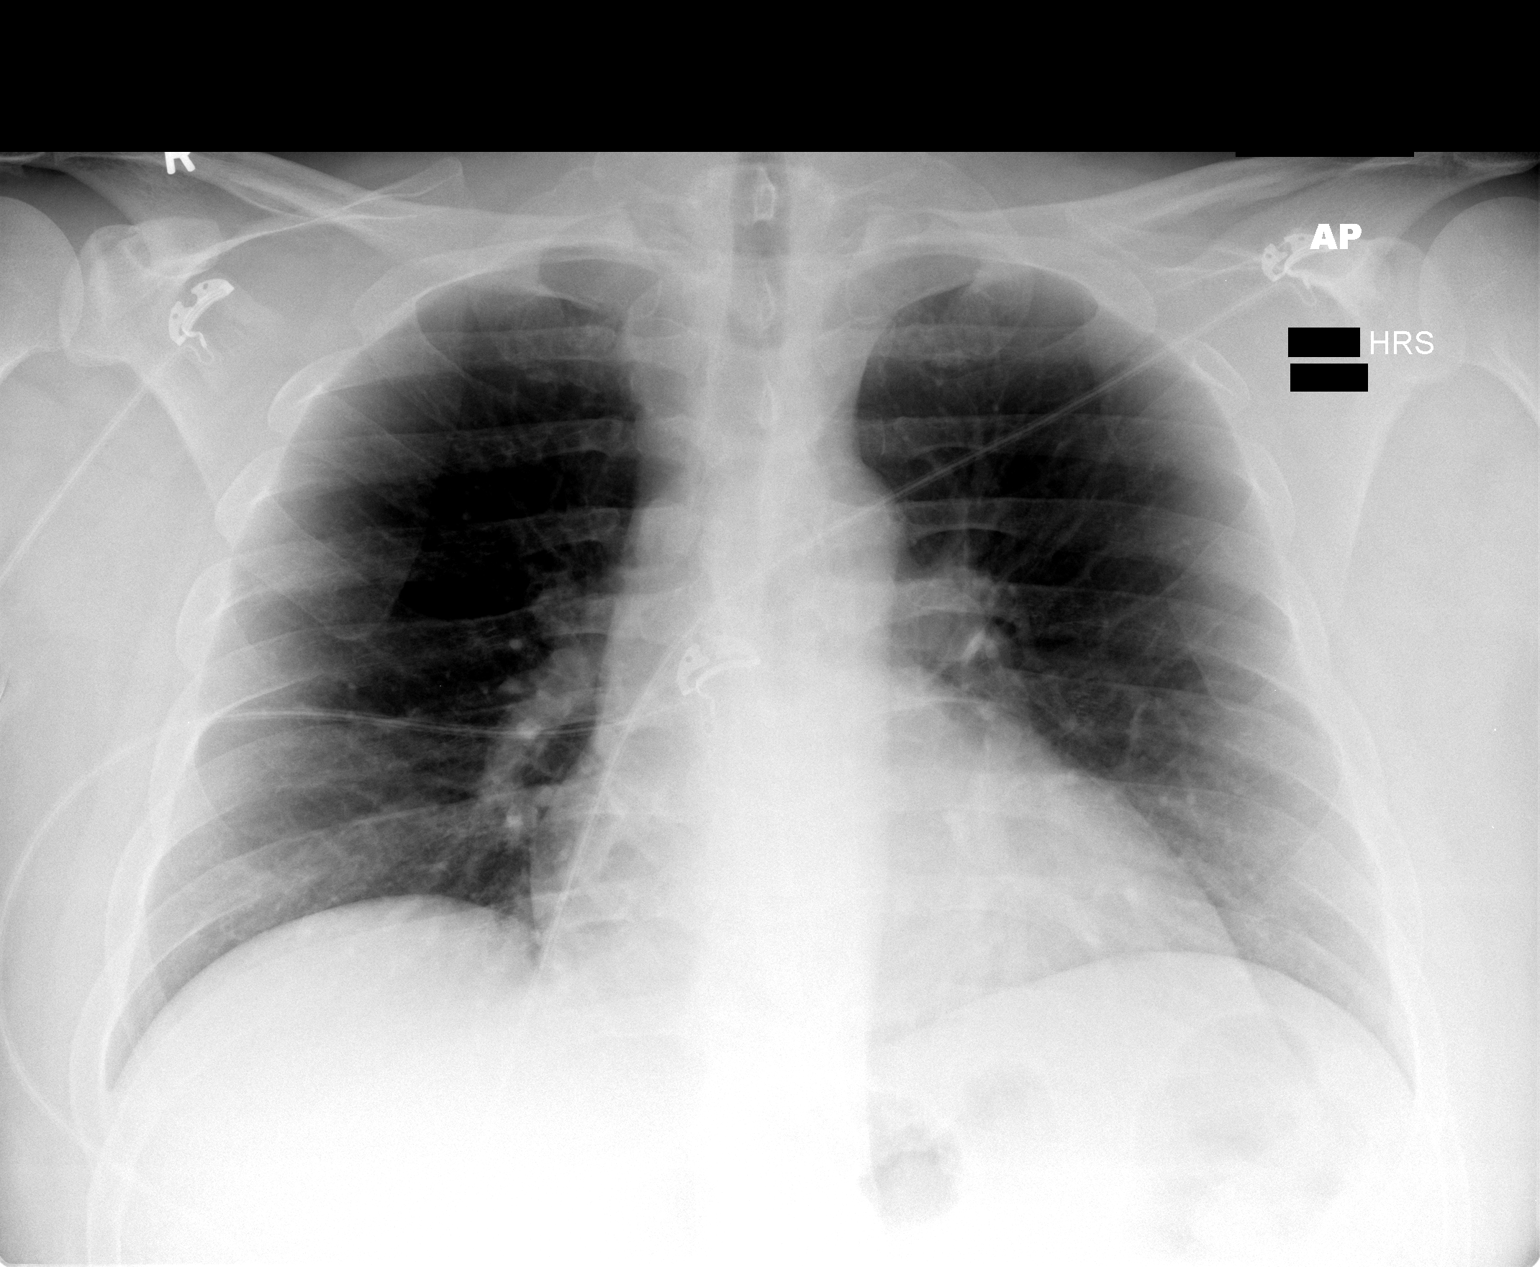

[1 of 1 positions shown; findings below may reference images not displayed]

FINDINGS: The lungs are well-aerated and clear.  There is no
evidence of focal opacification, pleural effusion or pneumothorax.

The cardiomediastinal silhouette is borderline normal in size.  No
acute osseous abnormalities are seen.
IMPRESSION: No acute cardiopulmonary process seen.

## 2018-09-22 ENCOUNTER — Other Ambulatory Visit: Payer: Self-pay

## 2018-09-22 ENCOUNTER — Encounter: Payer: Self-pay | Admitting: Podiatry

## 2018-09-22 ENCOUNTER — Ambulatory Visit: Payer: BC Managed Care – PPO | Admitting: Podiatry

## 2018-09-22 VITALS — BP 163/97 | HR 95 | Temp 98.2°F | Resp 16

## 2018-09-22 DIAGNOSIS — B079 Viral wart, unspecified: Secondary | ICD-10-CM | POA: Diagnosis not present

## 2018-09-22 NOTE — Progress Notes (Signed)
   Subjective:    Patient ID: Timothy Weiss, male    DOB: 1971-03-19, 48 y.o.   MRN: [id number]  HPI    Review of Systems  All other systems reviewed and are negative.      Objective:   Physical Exam        Assessment & Plan:

## 2018-09-25 NOTE — Progress Notes (Signed)
Subjective:   Patient ID: Timothy Weiss, male   DOB: 48 y.o.   MRN: [id number]   HPI Patient presents with multiple lesions plantar aspect left stating they get tender and they have been there for at least a month and is not sure if longer and it is hard for him to walk on them.  Patient does not smoke likes to be active   Review of Systems  All other systems reviewed and are negative.       Objective:  Physical Exam Vitals signs and nursing note reviewed.  Constitutional:      Appearance: He is well-developed.  Pulmonary:     Effort: Pulmonary effort is normal.  Musculoskeletal: Normal range of motion.  Skin:    General: Skin is warm.  Neurological:     Mental Status: He is alert.     Neurovascular status was found to be intact muscle strength adequate diabetes seems to be in good control with no neuropathic-like findings currently with numerous plantar lesions left that upon debridement shows pinpoint bleeding pain to lateral pressure     Assessment:  Verruca plantaris plantar left with pain     Plan:  H&P condition reviewed and sterile sharp debridement accomplished of all lesions and applied medication to create immune response along with sterile dressings.  Instructed what to do if blistering were to occur and reappoint 4 weeks or earlier if needed

## 2018-10-20 ENCOUNTER — Other Ambulatory Visit: Payer: Self-pay

## 2018-10-20 ENCOUNTER — Encounter: Payer: Self-pay | Admitting: Podiatry

## 2018-10-20 ENCOUNTER — Ambulatory Visit: Payer: BC Managed Care – PPO | Admitting: Podiatry

## 2018-10-20 DIAGNOSIS — B079 Viral wart, unspecified: Secondary | ICD-10-CM

## 2018-10-21 NOTE — Progress Notes (Signed)
Subjective:   Patient ID: Timothy Weiss, male   DOB: 48 y.o.   MRN: [id number]   HPI Patient states improved but he still has a lesion formation left plantar foot   ROS      Objective:  Physical Exam  Neurovascular status intact with lesions present improved but there still continues to be keratotic lesion formation     Assessment:  Verruca plantaris still present left     Plan:  Sterile prep deep debridement of lesion accomplished with application of chemical agent to create immune response with sterile dressing.  Gave instructions on soaks and what to do if any blistering were to occur and reappoint as needed

## 2019-04-09 ENCOUNTER — Other Ambulatory Visit: Payer: Self-pay

## 2019-04-09 ENCOUNTER — Ambulatory Visit
Admission: EM | Admit: 2019-04-09 | Discharge: 2019-04-09 | Disposition: A | Discharge status: Home / Self Care | Payer: BC Managed Care – PPO

## 2019-04-09 ENCOUNTER — Telehealth: Payer: Self-pay

## 2019-04-09 DIAGNOSIS — M10062 Idiopathic gout, left knee: Secondary | ICD-10-CM | POA: Diagnosis not present

## 2019-04-09 DIAGNOSIS — I10 Essential (primary) hypertension: Secondary | ICD-10-CM

## 2019-04-09 DIAGNOSIS — M25562 Pain in left knee: Secondary | ICD-10-CM | POA: Diagnosis not present

## 2019-04-09 DIAGNOSIS — M109 Gout, unspecified: Secondary | ICD-10-CM

## 2019-04-09 HISTORY — DX: Gout, unspecified: M10.9

## 2019-04-09 MED ORDER — METHYLPREDNISOLONE SODIUM SUCC 125 MG IJ SOLR
80.0000 mg | Freq: Once | INTRAMUSCULAR | Status: AC
  Administered 2019-04-09: 09:00:00 80 mg via INTRAMUSCULAR

## 2019-04-09 MED ORDER — PREDNISONE 10 MG PO TABS: 20.0000 mg | ORAL_TABLET | Freq: Two times a day (BID) | ORAL | 0 refills | Status: AC

## 2019-04-09 NOTE — ED Provider Notes (Signed)
[facility]   [id number] 04/09/19 Arrival Time: 1601  CC: Left knee pain  SUBJECTIVE: History from: patient. Timothy Weiss is a 49 y.o. male complains of left knee pain that began 5 days ago.  Symptoms began after he knelt down on wood/ laminate flooring.  Localizes the pain to the anterior left knee.  Describes the pain as intermittent and 9/10.  Has tried colchicine with minimal relief.  Symptoms are made worse with movement about the knee.  Hx of gout. Complains of associated swelling and erythema.  Denies fever, chills, ecchymosis, weakness, numbness and tingling  ROS: As per HPI.  All other pertinent ROS negative.     Past Medical History:  Diagnosis Date  . Chest pain   . Diabetes mellitus   . Gout   . Hyperlipidemia   . Hypertension    Past Surgical History:  Procedure Laterality Date  . CHOLECYSTECTOMY     Allergies  Allergen Reactions  . Metformin Nausea And Vomiting   No current facility-administered medications on file prior to encounter.   Current Outpatient Medications on File Prior to Encounter  Medication Sig Dispense Refill  . febuxostat (ULORIC) 40 MG tablet Take 40 mg by mouth daily.    Marland Kitchen atorvastatin (LIPITOR) 20 MG tablet Take 20 mg by mouth daily.     . insulin aspart (NOVOLOG FLEXPEN) 100 UNIT/ML injection Inject into the skin 3 (three) times daily before meals. As directed per sliding scale instructions     . lisinopril (PRINIVIL,ZESTRIL) 20 MG tablet Take 20 mg by mouth every morning.    Marland Kitchen losartan (COZAAR) 50 MG tablet     . TOUJEO SOLOSTAR 300 UNIT/ML SOPN      Social History   Socioeconomic History  . Marital status: Married    Spouse name: Not on file  . Number of children: Not on file  . Years of education: Not on file  . Highest education level: Not on file  Occupational History  . Not on file  Tobacco Use  . Smoking status: Never Smoker  . Smokeless tobacco: Never Used  Substance and Sexual Activity  . Alcohol use: No    . Drug use: No  . Sexual activity: Not on file  Other Topics Concern  . Not on file  Social History Narrative  . Not on file   Social Determinants of Health   Financial Resource Strain:   . Difficulty of Paying Living Expenses: Not on file  Food Insecurity:   . Worried About Programme researcher, broadcasting/film/video in the Last Year: Not on file  . Ran Out of Food in the Last Year: Not on file  Transportation Needs:   . Lack of Transportation (Medical): Not on file  . Lack of Transportation (Non-Medical): Not on file  Physical Activity:   . Days of Exercise per Week: Not on file  . Minutes of Exercise per Session: Not on file  Stress:   . Feeling of Stress : Not on file  Social Connections:   . Frequency of Communication with Friends and Family: Not on file  . Frequency of Social Gatherings with Friends and Family: Not on file  . Attends Religious Services: Not on file  . Active Member of Clubs or Organizations: Not on file  . Attends Banker Meetings: Not on file  . Marital Status: Not on file  Intimate Partner Violence:   . Fear of Current or Ex-Partner: Not on file  . Emotionally Abused: Not on  file  . Physically Abused: Not on file  . Sexually Abused: Not on file   Family History  Problem Relation Age of Onset  . Hypertension Mother   . Healthy Father     OBJECTIVE:  Vitals:   04/09/19 0822  BP: (!) 170/102  Pulse: 95  Resp: 16  Temp: 98.8 F (37.1 C)  TempSrc: Oral  SpO2: 97%    General appearance: ALERT; in no acute distress.  Head: NCAT Lungs: Normal respiratory effort CV: Dorsalis pedis pulse 2+ Musculoskeletal: Left knee Inspection: Swelling and erythema to anterior knee, skin intact Palpation: exquisitely TTP over anterior knee ROM: LROM about the knee Strength:  5/5 knee flexion, 5/5 knee extension Skin: warm and dry Neurologic: Ambulates with minimal difficulty; Sensation intact about the lower extremities Psychological: alert and cooperative;  normal mood and affect  ASSESSMENT & PLAN:  1. Acute pain of left knee   2. Acute gout of left knee, unspecified cause     Meds ordered this encounter  Medications  . methylPREDNISolone sodium succinate (SOLU-MEDROL) 125 mg/2 mL injection 80 mg   Steroid shot given in office Prescribed prednisone.  Take as directed and to completion Follow up with PCP for further evaluation and management Return or go to the ED if you have any new or worsening symptoms fever, chills, nausea, vomiting, increased redness, swelling, worsening symptoms despite medication ,etc...   Reviewed expectations re: course of current medical issues. Questions answered. Outlined signs and symptoms indicating need for more acute intervention. Patient verbalized understanding. After Visit Summary given.    Lestine Box, PA-C 04/09/19 [phone]

## 2019-04-09 NOTE — ED Triage Notes (Signed)
Pt presents to UC w/ c/o left knee pain. Pt states he was bent down on his knees 5 days ago and pain has gotten worse since then. Pt has been walking on it at work since then.  Hx gout. Pt's left knee is more red and swollen than right.

## 2019-04-09 NOTE — Discharge Instructions (Addendum)
Steroid shot given in office Prescribed prednisone.  Take as directed and to completion Follow up with PCP for further evaluation and management Return or go to the ED if you have any new or worsening symptoms fever, chills, nausea, vomiting, increased redness, swelling, worsening symptoms despite medication ,etc..Marland Kitchen

## 2020-01-18 ENCOUNTER — Ambulatory Visit: Payer: BC Managed Care – PPO | Attending: Internal Medicine

## 2020-01-18 DIAGNOSIS — Z23 Encounter for immunization: Secondary | ICD-10-CM

## 2020-01-18 NOTE — Progress Notes (Signed)
° °  Covid-19 Vaccination Clinic  Name:  SAVVA BEAMER    MRN: [id number] DOB: Jan 13, 1971  01/18/2020  Mr. Messer was observed post Covid-19 immunization for 15 minutes without incident. He was provided with Vaccine Information Sheet and instruction to access the V-Safe system.   Mr. Rampey was instructed to call 911 with any severe reactions post vaccine:  Difficulty breathing   Swelling of face and throat   A fast heartbeat   A bad rash all over body   Dizziness and weakness   Immunizations Administered    Name Date Dose VIS Date Route   Pfizer COVID-19 Vaccine 01/18/2020  3:58 PM 0.3 mL 05/24/2018 Intramuscular   Manufacturer: [employer], [employer]   Lot: [id number]   NDC: [address]-0

## 2020-02-08 ENCOUNTER — Ambulatory Visit: Payer: BC Managed Care – PPO | Attending: Internal Medicine

## 2020-02-08 DIAGNOSIS — Z23 Encounter for immunization: Secondary | ICD-10-CM

## 2020-02-08 NOTE — Progress Notes (Signed)
   Covid-19 Vaccination Clinic  Name:  Timothy Weiss    MRN: [id number] DOB: April 09, 1970  02/08/2020  Timothy Weiss was observed post Covid-19 immunization for 15 minutes without incident. He was provided with Vaccine Information Sheet and instruction to access the V-Safe system.   Timothy Weiss was instructed to call 911 with any severe reactions post vaccine: Marland Kitchen Difficulty breathing  . Swelling of face and throat  . A fast heartbeat  . A bad rash all over body  . Dizziness and weakness   Immunizations Administered    Name Date Dose VIS Date Route   Pfizer COVID-19 Vaccine 02/08/2020  3:44 PM 0.3 mL 01/17/2020 Intramuscular   Manufacturer: [employer], [employer]   Lot: [id number]   NDC: [address]-2

## 2021-06-18 ENCOUNTER — Telehealth: Payer: BC Managed Care – PPO | Admitting: Family

## 2021-06-18 DIAGNOSIS — J069 Acute upper respiratory infection, unspecified: Secondary | ICD-10-CM

## 2021-06-18 MED ORDER — FLUTICASONE PROPIONATE 50 MCG/ACT NA SUSP: 2.0000 | Freq: Every day | NASAL | 6 refills | Status: AC

## 2021-06-18 MED ORDER — BENZONATATE 100 MG PO CAPS: 100.0000 mg | ORAL_CAPSULE | Freq: Three times a day (TID) | ORAL | 0 refills | Status: DC | PRN

## 2021-06-18 NOTE — Progress Notes (Signed)

## 2021-10-23 ENCOUNTER — Ambulatory Visit
Admission: EM | Admit: 2021-10-23 | Discharge: 2021-10-23 | Disposition: A | Discharge status: Home / Self Care | Payer: BC Managed Care – PPO | Attending: Nurse Practitioner | Admitting: Nurse Practitioner

## 2021-10-23 ENCOUNTER — Ambulatory Visit: Payer: Self-pay

## 2021-10-23 ENCOUNTER — Encounter: Payer: Self-pay | Admitting: Emergency Medicine

## 2021-10-23 DIAGNOSIS — L500 Allergic urticaria: Secondary | ICD-10-CM | POA: Diagnosis not present

## 2021-10-23 MED ORDER — CETIRIZINE HCL 10 MG PO TABS: 10.0000 mg | ORAL_TABLET | Freq: Every day | ORAL | 0 refills | Status: AC

## 2021-10-23 MED ORDER — DEXAMETHASONE SODIUM PHOSPHATE 10 MG/ML IJ SOLN
10.0000 mg | Freq: Once | INTRAMUSCULAR | Status: AC
  Administered 2021-10-23: 10 mg via INTRAMUSCULAR

## 2021-10-23 MED ORDER — FAMOTIDINE 20 MG PO TABS: 20.0000 mg | ORAL_TABLET | Freq: Two times a day (BID) | ORAL | 0 refills | Status: AC

## 2021-10-23 NOTE — Discharge Instructions (Signed)
-   We have given you a steroid shot today to help with itching and inflammation - Please start on a daily oral nondrowsy antihistamine like cetirizine.  You can take Benadryl 25-50 mg at night time to help with itching over night - Also start on pepcid twice daily until the hives get better - Avoid tomatoes and the scented body wash  - Follow up with PCP/Allergist if symptoms do not improve with treatment - If symptoms worsen and you develop shortness of breath, throat or tongue swelling, go to ER

## 2021-10-23 NOTE — ED Triage Notes (Signed)
Spots all over yesterday and was told it was hives from tele doc visit.  Today hives are all over body.  States areas itch.

## 2021-10-23 NOTE — ED Provider Notes (Signed)
[facility]    CSN: [id number] Arrival date & time: 10/23/21  0907      History   Chief Complaint No chief complaint on file.   HPI Timothy Weiss is a 51 y.o. male.   Patient presents for "hives" that started yesterday morning.  Reports he took Benadryl a few times yesterday and they improved.  This morning, he woke up at 3 AM with worsening hives.  Reports it is on his inner thighs, trunk, back, bilateral arms.  Reports they are intensely itchy, red.  Denies burning, oozing, scaling, or blisters.  They are not painful.  He denies fevers, nausea/vomiting, shortness of breath, throat or tongue swelling or itching, new muscle pain or joint aches, upset stomach.  Reports a couple of days ago, he used a body wash that was scented.  He has since stopped using the body wash.  Also reports he has been eating a lot of tomatoes from his garden the past 3 to 4 days.  Denies any history of allergies or hives.  Medical history significant for type 2 diabetes, hyperlipidemia, hypertension.      Past Medical History:  Diagnosis Date   Chest pain    Diabetes mellitus    Gout    Hyperlipidemia    Hypertension     Patient Active Problem List   Diagnosis Date Noted   Chest pain 07/16/2012   DIABETES MELLITUS, TYPE II, UNCONTROLLED, WITH COMPLICATIONS 05/24/2006   HYPERLIPIDEMIA 04/03/2006   HYPERTENSION 04/03/2006    Past Surgical History:  Procedure Laterality Date   CHOLECYSTECTOMY         Home Medications    Prior to Admission medications   Medication Sig Start Date End Date Taking? Authorizing Provider  cetirizine (ZYRTEC) 10 MG tablet Take 1 tablet (10 mg total) by mouth daily. 10/23/21  Yes Valentino Nose, NP  famotidine (PEPCID) 20 MG tablet Take 1 tablet (20 mg total) by mouth 2 (two) times daily for 7 days. 10/23/21 10/30/21 Yes Valentino Nose, NP  atorvastatin (LIPITOR) 20 MG tablet Take 20 mg by mouth daily.  07/12/12   [provider]   febuxostat (ULORIC) 40 MG tablet Take 40 mg by mouth daily.    [provider]  fluticasone (FLONASE) 50 MCG/ACT nasal spray Place 2 sprays into both nostrils daily. 06/18/21   Jannifer Rodney A, FNP  insulin aspart (NOVOLOG FLEXPEN) 100 UNIT/ML injection Inject into the skin 3 (three) times daily before meals. As directed per sliding scale instructions     [provider]  lisinopril (PRINIVIL,ZESTRIL) 20 MG tablet Take 20 mg by mouth every morning.    [provider]  losartan (COZAAR) 50 MG tablet  09/20/18   [provider]  Laurette Schimke 300 UNIT/ML [facility]  09/20/18   [provider]    Family History Family History  Problem Relation Age of Onset   Hypertension Mother    Healthy Father     Social History Social History   Tobacco Use   Smoking status: Never   Smokeless tobacco: Never  Substance Use Topics   Alcohol use: No   Drug use: No     Allergies   Metformin   Review of Systems Review of Systems Per HPI  Physical Exam Triage Vital Signs ED Triage Vitals [10/23/21 0915]  Enc Vitals Group     BP (!) 149/99     Pulse Rate (!) 108     Resp 18  Temp 98.7 F (37.1 C)     Temp Source Oral     SpO2 97 %     Weight      Height      Head Circumference      Peak Flow      Pain Score 0     Pain Loc      Pain Edu?      Excl. in GC?    No data found.  Updated Vital Signs BP (!) 149/99 (BP Location: Right Arm)   Pulse (!) 108   Temp 98.7 F (37.1 C) (Oral)   Resp 18   SpO2 97%   Visual Acuity Right Eye Distance:   Left Eye Distance:   Bilateral Distance:    Right Eye Near:   Left Eye Near:    Bilateral Near:     Physical Exam   UC Treatments / Results  Labs (all labs ordered are listed, but only abnormal results are displayed) Labs Reviewed - No data to display  EKG   Radiology No results found.  Procedures Procedures (including critical care time)  Medications Ordered in UC Medications   dexamethasone (DECADRON) injection 10 mg (10 mg Intramuscular Given 10/23/21 0934)    Initial Impression / Assessment and Plan / UC Course  I have reviewed the triage vital signs and the nursing notes.  Pertinent labs & imaging results that were available during my care of the patient were reviewed by me and considered in my medical decision making (see chart for details).    Patient is a very pleasant, well-appearing 51 year old male presenting for allergic urticaria today.  Treat with Decadron 10 mg IM today in urgent care.  Start daily oral nondrowsy antihistamine and Benadryl 25 to 50 mg at nighttime as needed until hives improve.  Also discussed starting a Pepcid twice daily.  Prescription sent to pharmacy.  ER precautions discussed.  Follow-up with primary care provider or allergist if symptoms persist or worsen despite treatment.  Final Clinical Impressions(s) / UC Diagnoses   Final diagnoses:  Allergic urticaria     Discharge Instructions      - We have given you a steroid shot today to help with itching and inflammation - Please start on a daily oral nondrowsy antihistamine like cetirizine.  You can take Benadryl 25-50 mg at night time to help with itching over night - Also start on pepcid twice daily until the hives get better - Avoid tomatoes and the scented body wash  - Follow up with PCP/Allergist if symptoms do not improve with treatment - If symptoms worsen and you develop shortness of breath, throat or tongue swelling, go to ER    ED Prescriptions     Medication Sig Dispense Auth. Provider   cetirizine (ZYRTEC) 10 MG tablet Take 1 tablet (10 mg total) by mouth daily. 30 tablet Cathlean Marseilles A, NP   famotidine (PEPCID) 20 MG tablet Take 1 tablet (20 mg total) by mouth 2 (two) times daily for 7 days. 14 tablet Valentino Nose, NP      PDMP not reviewed this encounter.   Valentino Nose, NP 10/23/21 [phone]
